# Patient Record
Sex: Female | Born: 1990 | Race: White | Hispanic: No | Marital: Single
Health system: Midwestern US, Academic
[De-identification: ages and names within clinical notes are randomized; demographics above are authoritative.]

---

## 2017-07-16 ENCOUNTER — Encounter: Admit: 2017-07-16 | Discharge: 2017-07-16 | Payer: MEDICAID

## 2017-07-26 NOTE — Telephone Encounter
Navigation Intake Assessment Document    Patient Name:  CALLEIGH LAFONTANT  DOB:  [birth year]  Insurance:   Babs Bertin    Appointment Info:    Future Appointments  Date Time Provider [place]   08/02/2017 10:00 AM Lind Covert, DO [id number] [place] Exam         Diagnosis & Reason for Visit:  Idiopathic Thrombocytopenia    Physician Info:   Referring Physician: Wenda Low MD.   Contact Name & Number: [phone]          History of Present Illness:  The patient is a 26 year old female with a history of depression, anxiety, asthma and ITP. Patient reports blood related issues since 2007. She was admitted to [facility] on 07/24/11 from falling off a horse, low blood counts were not addressed during the hospital stay. Currently under the care of Dr. Alver Sorrow. A bone marrow biopsy was done on 05/22/14 was negative per the doctors report and no reports of splenomegaly. Platelet count is stable between 70-100,000, 07/18/17 CBC WBC:4.0L RBC:4.47 HGB:13.5 HCT:42.3 MCV:94.6 MCH:30.6 MCHC:32.0: RDW:12.1 PLT:78. Patient is being referred to hematology for further management of ITP        Comments:  Requested BMBX report, Labs and most recent doctors' note form Dr. Bud Face Office

## 2017-08-01 ENCOUNTER — Encounter: Admit: 2017-08-01 | Discharge: 2017-08-01 | Payer: MEDICAID

## 2017-08-01 DIAGNOSIS — F329 Major depressive disorder, single episode, unspecified: ICD-10-CM

## 2017-08-01 DIAGNOSIS — M797 Fibromyalgia: ICD-10-CM

## 2017-08-01 DIAGNOSIS — D693 Immune thrombocytopenic purpura: Principal | ICD-10-CM

## 2017-08-01 DIAGNOSIS — F419 Anxiety disorder, unspecified: ICD-10-CM

## 2017-08-02 ENCOUNTER — Encounter: Admit: 2017-08-02 | Discharge: 2017-08-02 | Payer: MEDICAID

## 2017-08-02 DIAGNOSIS — R5382 Chronic fatigue, unspecified: ICD-10-CM

## 2017-08-02 DIAGNOSIS — M419 Scoliosis, unspecified: ICD-10-CM

## 2017-08-02 DIAGNOSIS — R634 Abnormal weight loss: ICD-10-CM

## 2017-08-02 DIAGNOSIS — D696 Thrombocytopenia, unspecified: Principal | ICD-10-CM

## 2017-08-02 DIAGNOSIS — M549 Dorsalgia, unspecified: ICD-10-CM

## 2017-08-02 DIAGNOSIS — J3089 Other allergic rhinitis: ICD-10-CM

## 2017-08-02 DIAGNOSIS — D72810 Lymphocytopenia: ICD-10-CM

## 2017-08-02 DIAGNOSIS — R479 Unspecified speech disturbances: ICD-10-CM

## 2017-08-02 DIAGNOSIS — M797 Fibromyalgia: ICD-10-CM

## 2017-08-02 DIAGNOSIS — G43909 Migraine, unspecified, not intractable, without status migrainosus: ICD-10-CM

## 2017-08-02 DIAGNOSIS — F419 Anxiety disorder, unspecified: ICD-10-CM

## 2017-08-02 DIAGNOSIS — H9190 Unspecified hearing loss, unspecified ear: ICD-10-CM

## 2017-08-02 DIAGNOSIS — D693 Immune thrombocytopenic purpura: Principal | ICD-10-CM

## 2017-08-02 DIAGNOSIS — F329 Major depressive disorder, single episode, unspecified: ICD-10-CM

## 2017-08-02 DIAGNOSIS — Z87891 Personal history of nicotine dependence: ICD-10-CM

## 2017-08-02 DIAGNOSIS — J45909 Unspecified asthma, uncomplicated: ICD-10-CM

## 2017-08-02 LAB — TSH WITH FREE T4 REFLEX: Lab: 2 uU/mL (ref 0.35–5.00)

## 2017-08-02 LAB — COMPREHENSIVE METABOLIC PANEL
Lab: 137 MMOL/L (ref 137–147)
Lab: 27 MMOL/L — ABNORMAL HIGH (ref 21–30)
Lab: 3 (ref 3–12)
Lab: 4.5 MMOL/L (ref 3.5–5.1)
Lab: 8 mg/dL (ref 7–25)
Lab: 84 mg/dL (ref 70–100)
Lab: >60 mL/min (ref >60)

## 2017-08-02 LAB — IRON + BINDING CAPACITY + %SAT+ FERRITIN
Lab: 297 ug/dL — ABNORMAL LOW (ref >60)
Lab: 86 ug/dL (ref >60)

## 2017-08-02 LAB — CBC AND DIFF
Lab: 2.2 10*3/uL (ref 1.8–7.0)
Lab: 4.1 M/UL (ref 4.0–5.0)
Lab: 4.4 10*3/uL — ABNORMAL LOW (ref 4.5–11.0)

## 2017-08-02 LAB — IMMATURE PLATELET FRACTION: Lab: 34 % — ABNORMAL HIGH (ref >60)

## 2017-08-02 LAB — HEPATITIS PANEL, ACUTE
Lab: NEGATIVE % — ABNORMAL HIGH (ref 28–42)
Lab: NEGATIVE ng/mL — ABNORMAL LOW (ref 10–200)

## 2017-08-02 LAB — FOLATE, SERUM: Lab: >24 ng/mL (ref >3.9)

## 2017-08-02 LAB — VITAMIN B12: Lab: 276 pg/mL — ABNORMAL LOW (ref 180–914)

## 2017-08-02 NOTE — Progress Notes
08/02/2017    Name: Taylor Kaiser  DOB: Mar 09, 1991  MRN: [id number]    Primary Care Physician: Burnadette Pop     Chief Complaint:   Chief Complaint   Patient presents with   ??? Heme/Onc Care     History of Present Illness   Taylor Kaiser is a 26 year old female who presents to the clinic today with a friend/advocate who has known her well over the last several years.  He provides much of her history and reports that Taylor Kaiser has a speech deficit, often has difficulty understanding information spoken to her.  She presents for further evaluation of her chronic thrombocytosis.  This has been actually followed by an outside hematologist for some time but the patient and her advocate report poor understanding of what is occurring.  They believe she has been told she has something wrong with her spleen previously.  I have reviewed labs available in the [place] system dating back to 2012 which show a platelet count generally ranging from 60-80.  On July 18, 2017 she had a CBC showing WBC 4.0, platelets 78, absolute lymphocyte count 0.7.   She denies any petechiae, epistaxis, melena, hematochezia, menorrhagia.  She is not on any offending medications.  She does not drink any alcohol.     Past Medical History  Past Medical History:   Diagnosis Date   ??? Anxiety    ??? Asthma    ??? Back pain    ??? Chronic ITP (idiopathic thrombocytopenic purpura) (HCC)    ??? Depression    ??? Environmental and seasonal allergies    ??? Fibromyalgia    ??? Hearing reduced    ??? Migraines    ??? Scoliosis    ??? Speech disorder        Past Surgical History  Past Surgical History:   Procedure Laterality Date   ??? SKIN BIOPSY  07/2017   ??? EYE SURGERY      2x as a baby   ??? WISDOM TEETH EXTRACTION         Family History  Family History   Problem Relation Age of Onset   ??? High Cholesterol Father         unknownt to pt   ??? Depression Father    ??? Diabetes Father    ??? Hypertension Father    ??? Asthma Brother    ??? Depression Brother    ??? Arthritis-rheumatoid Mother unknown to pt   ??? Migraines Mother    ??? Unknown to Patient Sister        Social History  Social History     Social History   ??? Marital status: Single     Spouse name: N/A   ??? Number of children: N/A   ??? Years of education: N/A     Occupational History   ??? Not on file.     Social History Main Topics   ??? Smoking status: Former Smoker     Packs/day: 2.00     Years: 8.00     Types: Cigarettes     Quit date: 05/03/2011   ??? Smokeless tobacco: Never Used   ??? Alcohol use No   ??? Drug use: No   ??? Sexual activity: Not on file     Other Topics Concern   ??? Not on file     Social History Narrative   ??? No narrative on file        Medications    Current Outpatient Prescriptions:   ???  albuterol (  PROAIR HFA, VENTOLIN HFA, OR PROVENTIL HFA) 90 mcg/actuation inhaler, Inhale 2 puffs by mouth into the lungs every 6 hours as needed for Wheezing or Shortness of Breath. Shake well before use., Disp: , Rfl:   ???  amitriptyline (ELAVIL) 50 mg tablet, Take 50 mg by mouth at bedtime as needed., Disp: , Rfl:   ???  cetirizine (ZYRTEC) 10 mg tablet, Take 10 mg by mouth every morning., Disp: , Rfl:   ???  diclofenac potassium (ZIPSOR) 25 mg capsule, Take 2 mg by mouth as Needed. Take with food., Disp: , Rfl:   ???  EPINEPHrine (EPIPEN) 1 mg/mL injection pen (2-Pack), Inject 0.3 mg into the muscle once as needed. Inject 0.3 mg (1 Pen) into thigh if needed for anaphylactic reaction. May repeat in 5-15 minutes if needed., Disp: , Rfl:   ???  fluconazole (DIFLUCAN) 50 mg tablet, Take 50 mg by mouth daily., Disp: , Rfl:   ???  fluticasone (FLOVENT HFA) 110 mcg/actuation inhaler, Inhale 2 puffs by mouth into the lungs as Needed for Other...., Disp: , Rfl:   ???  HYDROcodone/acetaminophen (NORCO) 5/325 mg tablet, Take 1 tablet by mouth every 4 hours as needed for Pain, Disp: , Rfl:   ???  rizatriptan (MAXALT) 10 mg tablet, Take 10 mg by mouth as Needed. May repeat in 2 hours in needed, Disp: , Rfl:      Allergies:   Allergies   Allergen Reactions ??? Augmentin [Amoxicillin-Pot Clavulanate] ANAPHYLAXIS   ??? Clavulanic Acid UNKNOWN   ??? Penicillins UNKNOWN       Review of Systems  Review of Systems   Constitutional: Positive for activity change, appetite change, chills, fatigue and unexpected weight change.   HENT: Positive for drooling, sinus pressure and tinnitus.    Eyes: Positive for photophobia, discharge, redness, itching and visual disturbance.   Respiratory: Positive for apnea, cough, choking, chest tightness, shortness of breath, wheezing and stridor.    Cardiovascular: Positive for palpitations.   Gastrointestinal: Positive for abdominal pain, constipation, diarrhea, nausea and vomiting.   Endocrine: Negative.    Genitourinary: Positive for vaginal discharge.   Musculoskeletal: Positive for arthralgias, back pain, myalgias, neck pain and neck stiffness.   Skin: Negative.    Allergic/Immunologic: Negative.    Neurological: Positive for dizziness, speech difficulty, weakness, light-headedness and headaches.   Hematological: Bruises/bleeds easily.   Psychiatric/Behavioral: Positive for behavioral problems, confusion, decreased concentration and sleep disturbance. The patient is nervous/anxious and is hyperactive.    All other systems reviewed and are negative.      Pain Score: 7     Pain Addressed: Current regimen working to control pain.    Patient Evaluated for a Clinical Trial: Patient not eligible for a treatment trial (including not needing treatment, needs palliative care, in remission).    [place] Cooperative Oncology Group performance status is 0, Fully active, able to carry on all pre-disease performance without restriction.    Physical Exam  Vitals:    08/02/17 1004 08/02/17 1007   BP: 92/62    Pulse: 72    Resp: 18    Temp: 36.6 ???C (97.8 ???F)    TempSrc: Oral Oral   SpO2: 98%    Weight: 45.2 kg (99 lb 9.6 oz)    Height: 162.6 cm (64)       Physical Exam   Constitutional: She is oriented to person, place, and time and well-developed, well-nourished, and in no distress. No distress.   Eyes: EOM are normal. No scleral icterus.  Cardiovascular: Normal rate, regular rhythm, normal heart sounds and intact distal pulses.    Pulmonary/Chest: Effort normal and breath sounds normal. No respiratory distress.   Abdominal: Soft. She exhibits no distension. There is no tenderness.   Musculoskeletal: She exhibits no edema.   Lymphadenopathy:     She has no cervical adenopathy.   Neurological: She is alert and oriented to person, place, and time. No cranial nerve deficit. She exhibits normal muscle tone. Gait normal. Coordination normal.   Skin: No rash noted. No pallor.   Psychiatric: Mood, memory, affect and judgment normal.   Often soft spoken, defers much of talking to her advocate   Vitals reviewed.         Labs/ Imaging /Pathology      CBC w/DIFF  CBC with Diff Latest Ref Rng & Units 08/02/2017 07/28/2011 07/27/2011 07/26/2011 07/25/2011   WBC 4.5 - 11.0 K/UL 4.4(L) 3.3(L) 4.3(L) 3.3(L) 4.5   RBC 4.0 - 5.0 M/UL 4.16 3.60(L) 3.60(L) 3.60(L) 3.50(L)   HGB 12.0 - 15.0 GM/DL 16.1 11.9(L) 11.7(L) 11.6(L) 11.4(L)   HCT 36 - 45 % 38.3 34.4(L) 33.6(L) 33.9(L) 32.9(L)   MCV 80 - 100 FL 92.2 95.0 93.0 95.0 94.0   MCH 26 - 34 PG 30.9 33.0 32.0 33.0 32.0   MCHC 32.0 - 36.0 G/DL 09.6 04.5 40.9 81.1 91.4   RDW 11 - 15 % 12.5 11.9 12.0 11.9 11.9   PLT 150 - 400 K/UL 80(L) 77(L) 69(L) 62(L) 63(L)   MPV 7 - 11 FL 13.0(H) 14.0(H) 14.0(H) 11.0 13.0(H)       I reviewed previous hematology notes, labs For the visit today.    Assessment & Plan:  Taylor Kaiser is a 26 year old female with the following medical problems:    1.  Chronic thrombocytopenia, likely ITP with platelet counts ranging from 60-80 since 2012.  2.  Mild lymphopenia, asymptomatic.  3.  Weight loss  4.  Chronic fatigue    Current plan:  1.  Lab evaluation today including CBC, CMP, peripheral smear, TSH, B12, folate, iron panel, TSH, hepatitis panel, immature platelet fraction. 2.  Abdominal ultrasound at [facility]  3.  Return to clinic in 1 week to discuss the results.  4.  I offered them to see a dietitian, but they declined.   5.   We did also discuss that unfortunately and less 1 of the above labs return abnormal, I likely cannot help explain her fatigue, weight loss and that her grossly positive ROS likely cannot be attributed to these heme findings.     Thank you for allowing me to participate in her care.    Parts of this note were created with voice recognition software. Please excuse any grammatical or typographical errors.

## 2017-08-03 LAB — 25-OH VITAMIN D (D2 + D3): Lab: 38 ng/mL (ref 30–80)

## 2017-08-04 LAB — PERIPHERAL SMEAR

## 2017-08-06 ENCOUNTER — Encounter: Admit: 2017-08-06 | Discharge: 2017-08-06 | Payer: MEDICAID

## 2017-08-06 DIAGNOSIS — D696 Thrombocytopenia, unspecified: Principal | ICD-10-CM

## 2017-08-06 DIAGNOSIS — D72810 Lymphocytopenia: ICD-10-CM

## 2017-08-09 ENCOUNTER — Encounter: Admit: 2017-08-09 | Discharge: 2017-08-09 | Payer: MEDICAID

## 2017-08-09 DIAGNOSIS — R479 Unspecified speech disturbances: ICD-10-CM

## 2017-08-09 DIAGNOSIS — F419 Anxiety disorder, unspecified: ICD-10-CM

## 2017-08-09 DIAGNOSIS — D721 Eosinophilia: ICD-10-CM

## 2017-08-09 DIAGNOSIS — H9190 Unspecified hearing loss, unspecified ear: ICD-10-CM

## 2017-08-09 DIAGNOSIS — R634 Abnormal weight loss: ICD-10-CM

## 2017-08-09 DIAGNOSIS — B89 Unspecified parasitic disease: ICD-10-CM

## 2017-08-09 DIAGNOSIS — J45909 Unspecified asthma, uncomplicated: ICD-10-CM

## 2017-08-09 DIAGNOSIS — M549 Dorsalgia, unspecified: ICD-10-CM

## 2017-08-09 DIAGNOSIS — M797 Fibromyalgia: ICD-10-CM

## 2017-08-09 DIAGNOSIS — F329 Major depressive disorder, single episode, unspecified: ICD-10-CM

## 2017-08-09 DIAGNOSIS — J3089 Other allergic rhinitis: ICD-10-CM

## 2017-08-09 DIAGNOSIS — D693 Immune thrombocytopenic purpura: Principal | ICD-10-CM

## 2017-08-09 DIAGNOSIS — M419 Scoliosis, unspecified: ICD-10-CM

## 2017-08-09 DIAGNOSIS — D72810 Lymphocytopenia: ICD-10-CM

## 2017-08-09 DIAGNOSIS — G43909 Migraine, unspecified, not intractable, without status migrainosus: ICD-10-CM

## 2017-08-09 NOTE — Progress Notes
08/09/2017    Name: Taylor Kaiser  DOB: 1991/08/12  MRN: [id number]    Primary Care Physician: Burnadette Pop     Chief Complaint:   Chief Complaint   Patient presents with   ??? Heme/Onc Care       Hematology/Oncology History:  1.  ITP dating back to 2012.  Platelet count has remained 60-80.  No treatment needed unless platelet count drops less than 30.  Nominal ultrasound unremarkable.  2.  Mild lymphopenia.  Likely also autoimmune related.  Medically insignificant.  3.  Eosinophilia.  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    Interval Events  Taylor Kaiser returns to clinic today with her advocate.  She still does very minimal talking.  In further discussion today when questioning about the eosinophilia, they do state that she has received treatment recently for intestinal parasites.  They cannot recall the name of the medication but reports that they noted worms in her stool.  She completed the medication twice.  She has not had any other changes to her health since our last visit 1 week ago.    Past medical, surgical, and social histories were reviewed and updated.  See EMR.      Medications    Current Outpatient Prescriptions:   ???  albuterol (PROAIR HFA, VENTOLIN HFA, OR PROVENTIL HFA) 90 mcg/actuation inhaler, Inhale 2 puffs by mouth into the lungs every 6 hours as needed for Wheezing or Shortness of Breath. Shake well before use., Disp: , Rfl:   ???  amitriptyline (ELAVIL) 50 mg tablet, Take 50 mg by mouth at bedtime as needed., Disp: , Rfl:   ???  cetirizine (ZYRTEC) 10 mg tablet, Take 10 mg by mouth every morning., Disp: , Rfl:   ???  diclofenac potassium (ZIPSOR) 25 mg capsule, Take 2 mg by mouth as Needed. Take with food., Disp: , Rfl:   ???  EPINEPHrine (EPIPEN) 1 mg/mL injection pen (2-Pack), Inject 0.3 mg into the muscle once as needed. Inject 0.3 mg (1 Pen) into thigh if needed for anaphylactic reaction. May repeat in 5-15 minutes if needed., Disp: , Rfl: ???  fluconazole (DIFLUCAN) 50 mg tablet, Take 50 mg by mouth daily., Disp: , Rfl:   ???  fluticasone (FLOVENT HFA) 110 mcg/actuation inhaler, Inhale 2 puffs by mouth into the lungs as Needed for Other...., Disp: , Rfl:   ???  HYDROcodone/acetaminophen (NORCO) 5/325 mg tablet, Take 1 tablet by mouth every 4 hours as needed for Pain, Disp: , Rfl:   ???  rizatriptan (MAXALT) 10 mg tablet, Take 10 mg by mouth as Needed. May repeat in 2 hours in needed, Disp: , Rfl:      Allergies:   Allergies   Allergen Reactions   ??? Augmentin [Amoxicillin-Pot Clavulanate] ANAPHYLAXIS   ??? Clavulanic Acid UNKNOWN   ??? Penicillins UNKNOWN       Review of Systems  Review of Systems   Constitutional: Positive for activity change, appetite change, chills, fatigue and unexpected weight change.   HENT: Positive for drooling, sinus pressure and tinnitus.    Eyes: Positive for photophobia, discharge, redness, itching and visual disturbance.   Respiratory: Positive for apnea, cough, choking, chest tightness, shortness of breath, wheezing and stridor.    Cardiovascular: Positive for palpitations.   Gastrointestinal: Positive for abdominal pain, constipation, diarrhea, nausea and vomiting.   Endocrine: Negative.    Genitourinary: Positive for vaginal discharge.   Musculoskeletal: Positive for arthralgias, back pain, myalgias, neck pain and neck stiffness.  Skin: Negative.    Allergic/Immunologic: Negative.    Neurological: Positive for dizziness, speech difficulty, weakness, light-headedness and headaches.   Hematological: Bruises/bleeds easily.   Psychiatric/Behavioral: Positive for behavioral problems, confusion, decreased concentration and sleep disturbance. The patient is nervous/anxious and is hyperactive.    All other systems reviewed and are negative.      Pain Score: 9     Pain Addressed: Current regimen working to control pain.  Did not appear to be in any pain or describe pain today to me. Patient Evaluated for a Clinical Trial: Patient not eligible for a treatment trial (including not needing treatment, needs palliative care, in remission).    [place] Cooperative Oncology Group performance status is 0, Fully active, able to carry on all pre-disease performance without restriction.    Physical Exam  Vitals:    08/09/17 0906 08/09/17 0907   BP: 107/63    Pulse: 62    Resp: 19    Temp: 36.6 ???C (97.9 ???F)    TempSrc: Oral Oral   SpO2: 100%    Weight: 44.7 kg (98 lb 9.6 oz)    Height: 162.6 cm (64.02)       Physical Exam   Constitutional: She is oriented to person, place, and time and well-developed, well-nourished, and in no distress. No distress.   Eyes: EOM are normal.   Cardiovascular: Normal rate.    Pulmonary/Chest: Effort normal. No respiratory distress.   Abdominal: Soft.   Musculoskeletal: She exhibits no edema.   Neurological: She is alert and oriented to person, place, and time. No cranial nerve deficit. She exhibits normal muscle tone. Gait normal. Coordination normal.   Skin: No rash noted. No pallor.   Psychiatric: Mood, memory, affect and judgment normal.   Often soft spoken, defers much of talking to her advocate   Vitals reviewed.         Labs/ Imaging /Pathology     CBC w/DIFF  CBC with Diff Latest Ref Rng & Units 08/02/2017 07/28/2011 07/27/2011 07/26/2011 07/25/2011   WBC 4.5 - 11.0 K/UL 4.4(L) 3.3(L) 4.3(L) 3.3(L) 4.5   RBC 4.0 - 5.0 M/UL 4.16 3.60(L) 3.60(L) 3.60(L) 3.50(L)   HGB 12.0 - 15.0 GM/DL 14.7 11.9(L) 11.7(L) 11.6(L) 11.4(L)   HCT 36 - 45 % 38.3 34.4(L) 33.6(L) 33.9(L) 32.9(L)   MCV 80 - 100 FL 92.2 95.0 93.0 95.0 94.0   MCH 26 - 34 PG 30.9 33.0 32.0 33.0 32.0   MCHC 32.0 - 36.0 G/DL 82.9 56.2 13.0 86.5 78.4   RDW 11 - 15 % 12.5 11.9 12.0 11.9 11.9   PLT 150 - 400 K/UL 80(L) 77(L) 69(L) 62(L) 63(L)   MPV 7 - 11 FL 13.0(H) 14.0(H) 14.0(H) 11.0 13.0(H)     Comprehensive Metabolic Profile  CMP Latest Ref Rng & Units 08/02/2017 07/28/2011 07/27/2011 07/26/2011 07/25/2011 NA 137 - 147 MMOL/L 137 140 137 137 140   K 3.5 - 5.1 MMOL/L 4.5 3.4(L) 3.6 4.2 3.6   CL 98 - 110 MMOL/L 107 109 104 108 109   CO2 21 - 30 MMOL/L 27 28 27 24 25    GAP 3 - 12 3 3(L) 6(L) 5(L) 6(L)   BUN 7 - 25 MG/DL 8 6(L) 7(L) 5(L) 9   CR 0.4 - 1.00 MG/DL 6.96 2.95 2.84 1.32 4.40   GLUX 70 - 100 MG/DL 84 102(V) 85 85 79   CA 8.5 - 10.6 MG/DL 9.8 2.5(D) 6.6(Y) 4.0(H) 8.3(L)   TP 6.0 - 8.0 G/DL 7.6 - - - -  ALB 3.5 - 5.0 G/DL 4.4 - - - -   ALKP 25 - 110 U/L 45 - - - -   ALT 7 - 56 U/L 10 - - - -   TBILI 0.3 - 1.2 MG/DL 0.7 - - - -   GFR >57 mL/min >60 >60 >60 >60 >60   GFRAA >60 mL/min >60 >60 >60 >60 >60     Results for PHENYX, MATSUSHITA A (MRN [id number]) as of 08/09/2017 10:05   Ref. Range 08/02/2017 11:07   Serum Folate Latest Ref Range: >3.9 NG/ML >24.0   Vitamin B12 Latest Ref Range: 180 - 914 PG/ML 276   Iron Latest Ref Range: 50 - 160 MCG/DL 86   % Saturation Latest Ref Range: 28 - 42 % 29   Iron Binding-TIBC Latest Ref Range: 270 - 380 MCG/DL 528   Ferritin Latest Ref Range: 10 - 200 NG/ML 69   Immature Platelet Fraction Latest Ref Range: 1.1 - 7.1 % 34.6 (H)   Results for DANNAH, NJOKU A (MRN [id number]) as of 08/09/2017 10:05   Ref. Range 08/02/2017 11:07   TSH Latest Ref Range: 0.35 - 5.00 MCU/ML 2.020   Vitamin D(25-OH)Total Latest Ref Range: 30 - 80 NG/ML 38.9   Hepatitis A IgM Latest Ref Range: NEG-NEG  NEG   Anti HBc IgM Latest Ref Range: NEG-NEG  NEG   HBsAg Latest Ref Range: NEG-NEG  NEG   Anti HCV Latest Ref Range: NEG-NEG  NEG   Smear: EOSINOPHILIA PRESENT, WHICH CAN BE CAUSED BY PARASITE INFECTION, ALLERGY,   ADRENAL INSUFFICIENCY, MEDICATIONS, NEOPLASIA, COLLAGEN VASCULAR DISEASE, AND   IDIOPATHIC HYPEREOSINOPHILIC SYNDROME.   RED CELLS APPEAR NORMAL   MODERATE THROMBOCYTOPENIA WITH NORMAL PLATELET MORPHOLOGY.     Abd U/S neg for HSMG or LAD    Assessment & Plan:  Meladie is a 26 year old female with the following medical problems:  ??? 1.  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  Abdominal ultrasound negative August 2018.  No treatment needed unless platelet count drops less than 30.  2.  Mild lymphopenia, clinically insignificant.  3.  Eosinophilia.  Likely due to recent parasite infection status post reported treatment.  Also could be related to allergies/asthma.  Do not suspect any malignancy.  4.  Weight loss, chronic fatigue.  Very well could be related to recent parasite infection as well.  No hematologic etiology.  Abdominal ultrasound was negative.    Current plan:  1.  Return to the clinic in 6 months with repeat CBC that day.  2.  No treatment for her ITP needed unless the platelet count drops less than 30.  3.  Discussed monitoring for significant epistaxis, gingival bleeding, petechiae.  If any of the symptoms start, she should let our clinic know immediately and we will evaluate if her platelet count has dropped.  4.  Defer further management of the parasite infection to her PCP, but this is likely the etiology of her eosinophilia.    Thank you for allowing me to participate in her care.    I spent 20 minutes with she and her advocate from 9:00 to 9:20 AM discussing her labs, abdominal ultrasound, symptoms, plans for monitoring, parasite infection and significance of this information.  100% of the time was spent in patient counseling.    Parts of this note were created with voice recognition software. Please excuse any grammatical or typographical errors.

## 2018-02-11 ENCOUNTER — Encounter: Admit: 2018-02-11 | Discharge: 2018-02-11 | Payer: MEDICAID

## 2018-06-25 ENCOUNTER — Ambulatory Visit: Admit: 2018-06-25 | Discharge: 2018-06-25 | Payer: MEDICAID

## 2018-06-25 ENCOUNTER — Encounter: Admit: 2018-06-25 | Discharge: 2018-06-25 | Payer: MEDICAID

## 2018-06-25 ENCOUNTER — Ambulatory Visit: Admit: 2018-06-25 | Discharge: 2018-06-26 | Payer: MEDICAID

## 2018-06-26 DIAGNOSIS — Z3141 Encounter for fertility testing: Principal | ICD-10-CM

## 2018-06-26 LAB — RUBELLA AB IGG

## 2018-06-26 LAB — THYROID STIMULATING HORMONE-TSH: Lab: 1.7 uU/mL (ref 0.35–5.00)

## 2018-06-27 LAB — VARICELLA ZOSTER AB IGG: Lab: POSITIVE

## 2018-06-28 LAB — ANTI-MULLERIAN HORMONE (AMH): Lab: 2.2

## 2021-10-20 ENCOUNTER — Encounter: Admit: 2021-10-20 | Discharge: 2021-10-20 | Payer: MEDICAID

## 2023-09-26 IMAGING — CT ABDOMEN_PELVIS WO(Adult)
2 of 3 series · 12 of 46 positions shown, 14 images · non-contrast
Comparison: None

PROCEDURE: ABDOMEN_PELVIS WO(Adult)
HISTORY: laparoscopic abdominal surgery yesterday for ovarian cyst, pain
ct/nm 0/0
TECHNIQUE: Axial CT of the abdomen and pelvis was obtained without the use of intravenous contrast.
Subsequently, coronal and sagittal reconstructions were obtained at a separate workstation. This
exam was
performed using one or more the following dose reduction techniques: Automated exposure control,
adjustment of the mA and/or KV according to the patient's size or use of iterative reconstruction
technique.
Total DLP dose measures 215 mGyXcm with a total CTDI dose measuring 4.32 mGy.

[Series 2: abdomen_pelvis ax 3.00 br40 s3 · axial · 0.53mm/px · z∈[+1566,+1968]mm · 9 of 134 slices shown, 11 images]
[im 9/134  soft-tissue]
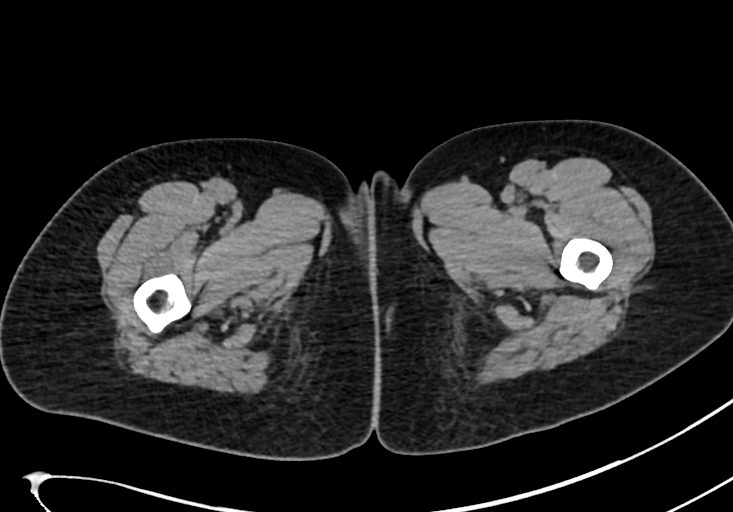
[im 9/134  bone]
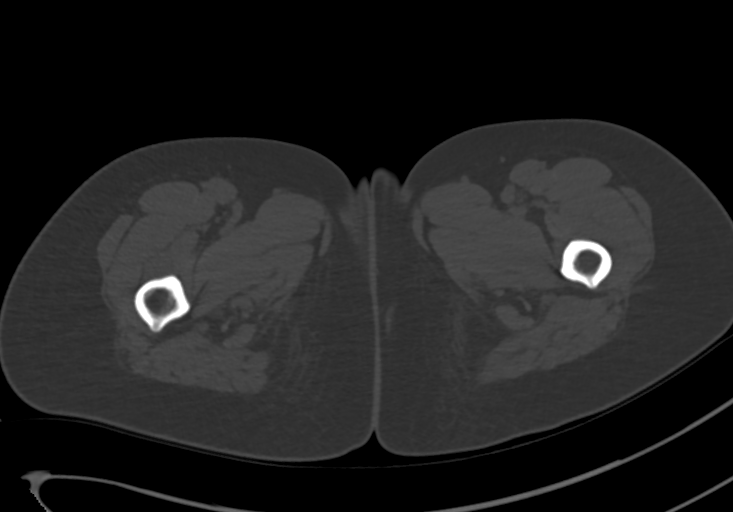
[im 26/134  soft-tissue]
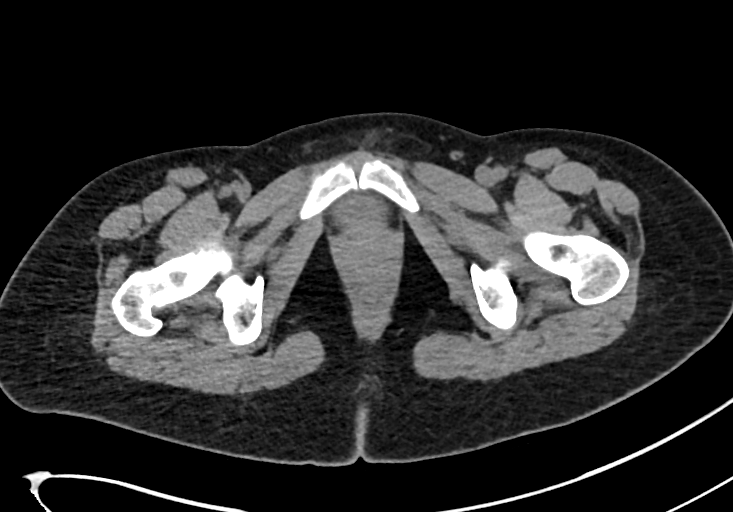
[im 39/134  soft-tissue]
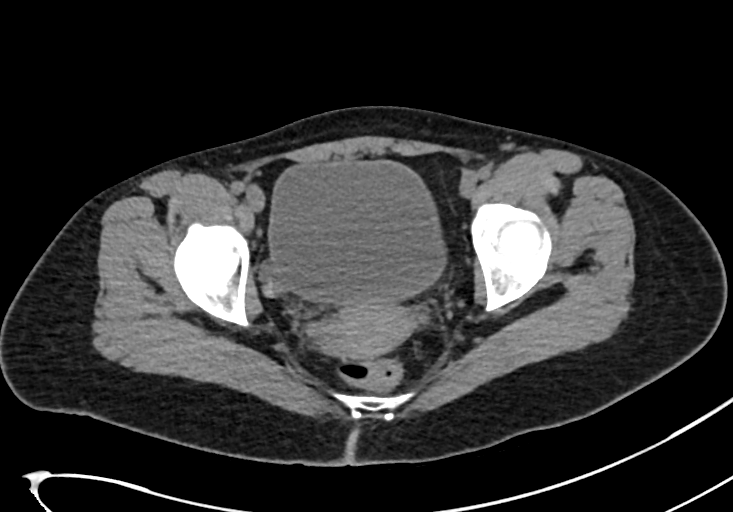
[im 52/134  soft-tissue]
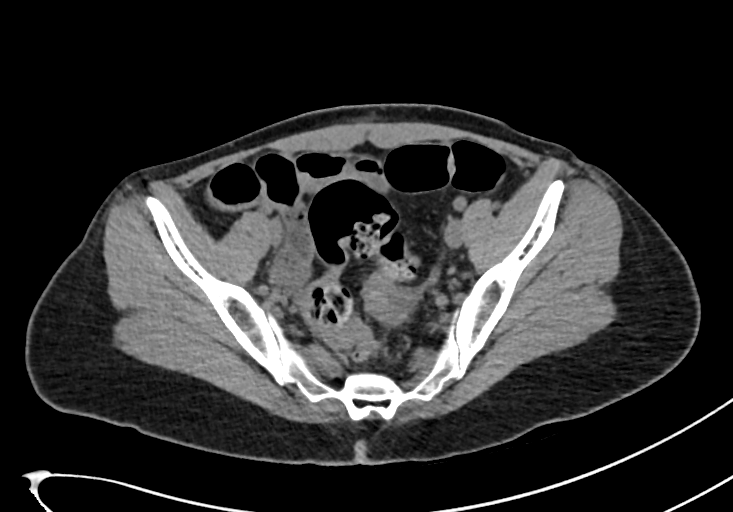
[im 69/134  soft-tissue]
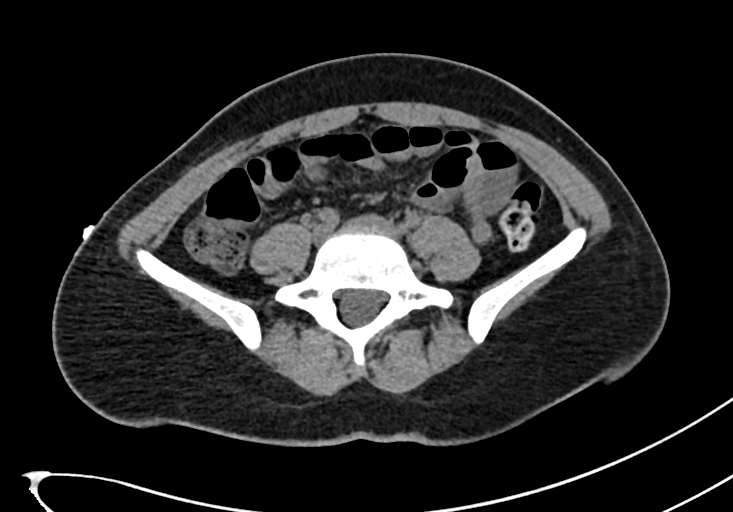
[im 82/134  soft-tissue]
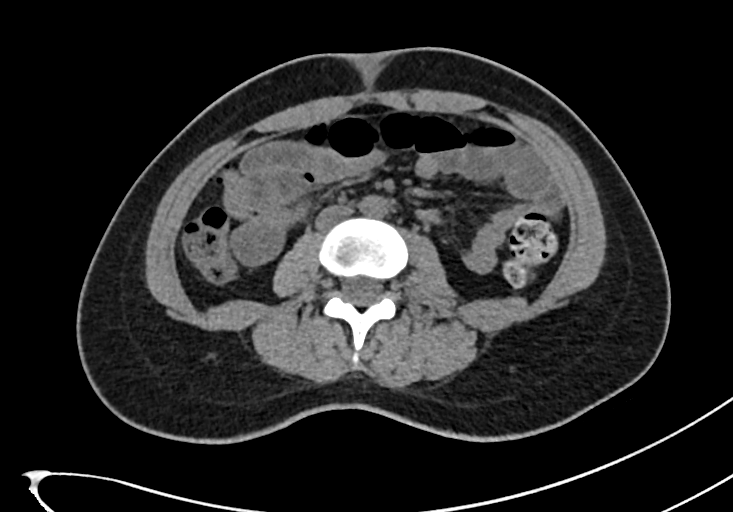
[im 95/134  soft-tissue]
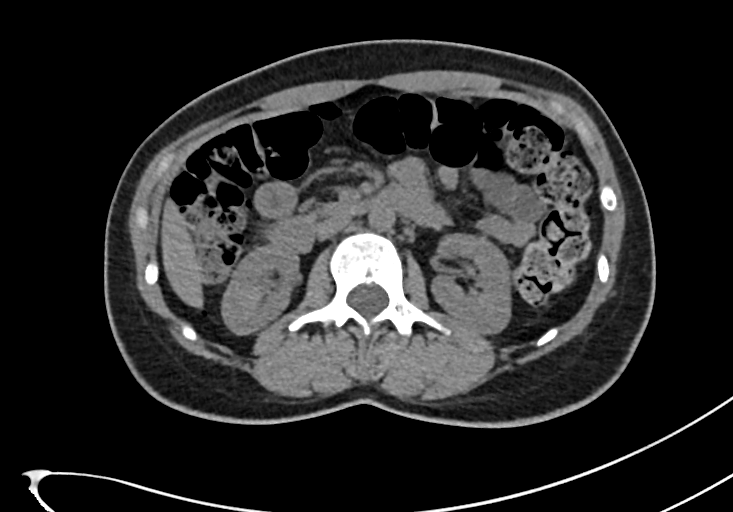
[im 112/134  soft-tissue]
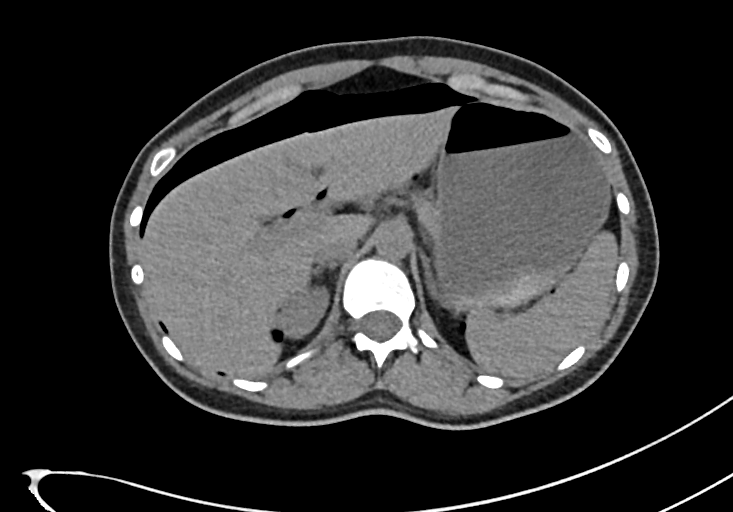
[im 125/134  soft-tissue]
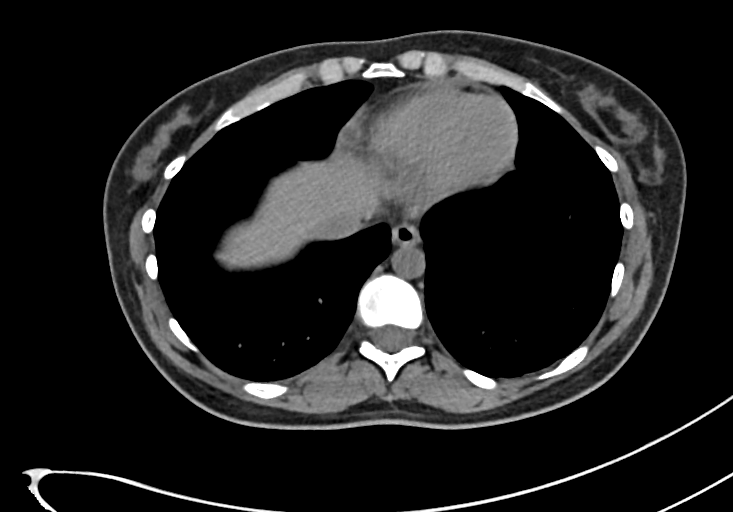
[im 125/134  bone]
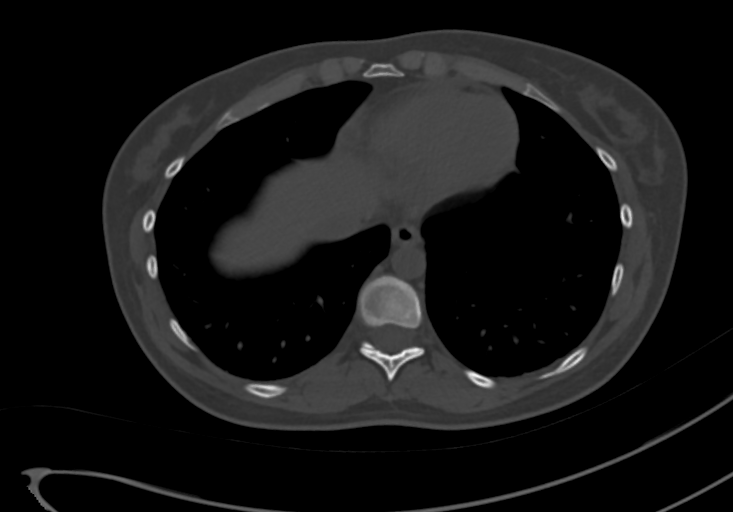

[Series 4: abdomen_pelvis cor 3.00 br40 s3 · coronal · 0.75mm/px · 3 of 80 slices shown]
[im 27/80  soft-tissue]
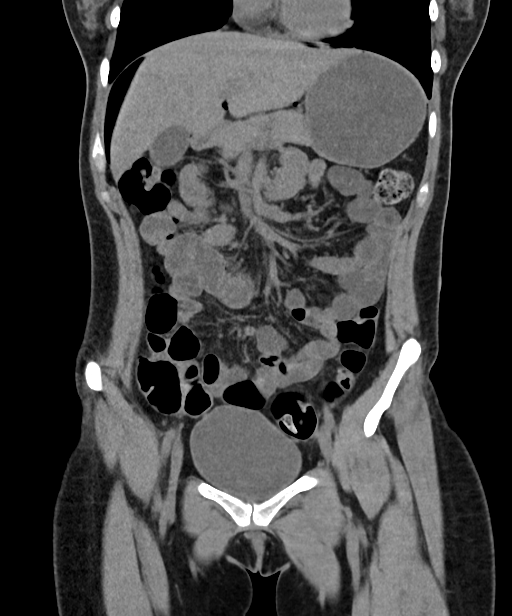
[im 36/80  soft-tissue]
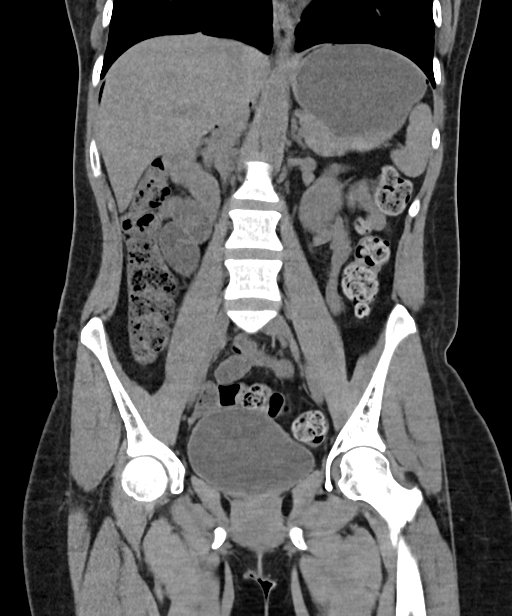
[im 44/80  soft-tissue]
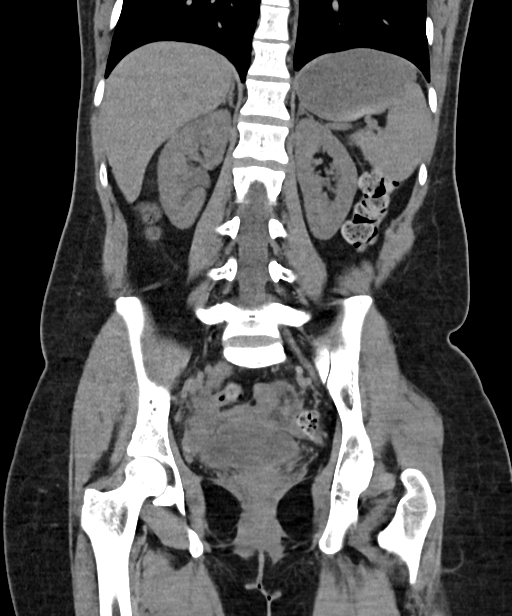

[12 of 46 positions shown; findings below may reference images not displayed]

FINDINGS: Imaged chest
No acute findings.

Abdomen/pelvis
There is postsurgical abdominal free air.

The liver, spleen, pancreas, adrenal glands and kidneys are unremarkable.
The gallbladder is normal. No extrahepatic or intrahepatic biliary ductal dilatation.
The bowel gas pattern is nonobstructive.
The aorta is normal in course and caliber.
No mesenteric root or retroperitoneal lymphadenopathy.

Review of the remaining osseous and soft tissue structures demonstrates no acute findings.
IMPRESSION: 1. Expected postsurgical changes without acute findings.

Tech Notes:

laparoscopic abdominal surgery yesterday for ovarian cyst
ct/nm 0/0
